# Patient Record
Sex: Male | Born: 1995 | Race: White | Hispanic: No | Marital: Single | Smoking: Never smoker
Health system: Southern US, Community
[De-identification: ages and names within clinical notes are randomized; demographics above are authoritative.]

## PROBLEM LIST (undated history)

## (undated) DIAGNOSIS — J45909 Unspecified asthma, uncomplicated: Secondary | ICD-10-CM

## (undated) DIAGNOSIS — T7840XA Allergy, unspecified, initial encounter: Secondary | ICD-10-CM

## (undated) HISTORY — PX: TONSILLECTOMY: SUR1361

## (undated) HISTORY — DX: Unspecified asthma, uncomplicated: J45.909

---
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
## 2017-08-16 ENCOUNTER — Encounter: Payer: Self-pay | Admitting: Podiatry

## 2017-08-16 ENCOUNTER — Ambulatory Visit (INDEPENDENT_AMBULATORY_CARE_PROVIDER_SITE_OTHER): Payer: BLUE CROSS/BLUE SHIELD | Admitting: Podiatry

## 2017-08-16 VITALS — BP 135/91 | HR 68 | Resp 16

## 2017-08-16 DIAGNOSIS — L600 Ingrowing nail: Secondary | ICD-10-CM

## 2017-08-16 NOTE — Patient Instructions (Signed)

## 2017-08-16 NOTE — Progress Notes (Signed)
Subjective:    Patient ID: Jonathan Mitchell, male   DOB: 21 y.o.   MRN: [id number]   HPI patient presents with a painful hallux nail deformity bilateral stating that it's been going on for a while he's tried to soak it and trim it without relief of symptoms    ROS      Objective:  Physical Exam neurovascular status intact with incurvated lateral border right hallux and medial border the left hallux with pain and distal redness but no active drainage noted     Assessment:    Chronic ingrown toenail deformity hallux bilateral     Plan:    H&P conditions reviewed and recommended removal of the borders explaining procedure and risk. Patient signed consent form and today I infiltrated each hallux 60 mg like Marcaine mixture and remove the lateral border the right hallux medial border the left hallux exposed matrix and applied phenol 3 applications 30 seconds followed by alcohol lavaged to each border followed by sterile dressing. Gave instructions on soaks and reappoint

## 2018-10-19 ENCOUNTER — Other Ambulatory Visit: Payer: Self-pay

## 2018-10-19 ENCOUNTER — Emergency Department (HOSPITAL_COMMUNITY)
Admission: EM | Admit: 2018-10-19 | Discharge: 2018-10-19 | Disposition: A | Discharge status: Home / Self Care | Payer: BLUE CROSS/BLUE SHIELD | Attending: Emergency Medicine | Admitting: Emergency Medicine

## 2018-10-19 ENCOUNTER — Encounter (HOSPITAL_COMMUNITY): Payer: Self-pay

## 2018-10-19 ENCOUNTER — Emergency Department (HOSPITAL_COMMUNITY): Payer: BLUE CROSS/BLUE SHIELD

## 2018-10-19 DIAGNOSIS — T22112A Burn of first degree of left forearm, initial encounter: Secondary | ICD-10-CM | POA: Diagnosis not present

## 2018-10-19 DIAGNOSIS — Z23 Encounter for immunization: Secondary | ICD-10-CM | POA: Insufficient documentation

## 2018-10-19 DIAGNOSIS — T2007XA Burn of unspecified degree of neck, initial encounter: Secondary | ICD-10-CM | POA: Diagnosis present

## 2018-10-19 DIAGNOSIS — T22111A Burn of first degree of right forearm, initial encounter: Secondary | ICD-10-CM | POA: Insufficient documentation

## 2018-10-19 DIAGNOSIS — X088XXA Exposure to other specified smoke, fire and flames, initial encounter: Secondary | ICD-10-CM | POA: Diagnosis not present

## 2018-10-19 DIAGNOSIS — Y9289 Other specified places as the place of occurrence of the external cause: Secondary | ICD-10-CM | POA: Diagnosis not present

## 2018-10-19 DIAGNOSIS — Y990 Civilian activity done for income or pay: Secondary | ICD-10-CM | POA: Insufficient documentation

## 2018-10-19 DIAGNOSIS — T2017XA Burn of first degree of neck, initial encounter: Secondary | ICD-10-CM | POA: Insufficient documentation

## 2018-10-19 DIAGNOSIS — T300 Burn of unspecified body region, unspecified degree: Secondary | ICD-10-CM

## 2018-10-19 DIAGNOSIS — Y9389 Activity, other specified: Secondary | ICD-10-CM | POA: Diagnosis not present

## 2018-10-19 DIAGNOSIS — Z79899 Other long term (current) drug therapy: Secondary | ICD-10-CM | POA: Insufficient documentation

## 2018-10-19 MED ORDER — IBUPROFEN 600 MG PO TABS: 600.0000 mg | ORAL_TABLET | Freq: Four times a day (QID) | ORAL | 0 refills | Status: AC | PRN

## 2018-10-19 MED ORDER — SILVER SULFADIAZINE 1 % EX CREA
TOPICAL_CREAM | Freq: Once | CUTANEOUS | Status: AC
  Administered 2018-10-19: 17:00:00 via TOPICAL
  Filled 2018-10-19: qty 50

## 2018-10-19 MED ORDER — TETANUS-DIPHTH-ACELL PERTUSSIS 5-2.5-18.5 LF-MCG/0.5 IM SUSP
0.5000 mL | Freq: Once | INTRAMUSCULAR | Status: AC
  Administered 2018-10-19: 0.5 mL via INTRAMUSCULAR
  Filled 2018-10-19: qty 0.5

## 2018-10-19 MED ORDER — IBUPROFEN 800 MG PO TABS
800.0000 mg | ORAL_TABLET | Freq: Once | ORAL | Status: AC
  Administered 2018-10-19: 800 mg via ORAL
  Filled 2018-10-19: qty 1
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
## 2018-10-19 NOTE — Discharge Instructions (Signed)
Please cleanse skin and apply silvadene cream to burn area twice daily.  Monitor for signs of infection.  Take ibuprofen as needed for pain. Return if your condition worsen, if you have trouble breathing or if you have any concerns.

## 2019-03-18 ENCOUNTER — Ambulatory Visit: Payer: Self-pay | Admitting: *Deleted

## 2019-03-18 ENCOUNTER — Telehealth: Payer: PRIVATE HEALTH INSURANCE | Admitting: Nurse Practitioner

## 2019-03-18 DIAGNOSIS — J069 Acute upper respiratory infection, unspecified: Secondary | ICD-10-CM

## 2019-03-18 MED ORDER — BENZONATATE 100 MG PO CAPS: 100.0000 mg | ORAL_CAPSULE | Freq: Three times a day (TID) | ORAL | 0 refills | Status: AC | PRN

## 2019-03-18 NOTE — Telephone Encounter (Signed)
Pt called with having a sore throat and some shortness of breath with exertion. Temp yesterday was 98.6 and today up to 100.  He has a dry cough, and a  runny nose.He denies body aches, headache, or chest tightness.  He lives with his girlfriend, and she does not have any symptoms. He stated that he normally sees a provider at [facility]. Advised that he could go to an urgent care for his symptoms. He voiced understanding. Will call back for increase in symptoms.  Reason for Disposition . MILD difficulty breathing (e.g., minimal/no SOB at rest, SOB with walking, pulse <100)  Answer Assessment - Initial Assessment Questions .  COVID-19 DIAGNOSIS: "Who made your Coronavirus (COVID-19) diagnosis?" "Was it confirmed by a positive lab test?" If not diagnosed by a HCP, ask "Are there lots of cases (community spread) where you live?" (See public health department website, if unsure)   * MAJOR community spread: high number of cases; numbers of cases are increasing; many people hospitalized.   * MINOR community spread: low number of cases; not increasing; few or no people hospitalized     In the community 2. ONSET: "When did the COVID-19 symptoms start?"      today 3. WORST SYMPTOM: "What is your worst symptom?" (e.g., cough, fever, shortness of breath, muscle aches)     Sore throat 4. COUGH: "How bad is the cough?"       Dry cough 5. FEVER: "Do you have a fever?" If so, ask: "What is your temperature, how was it measured, and when did it start?"     Was 98.6 yesterday and up to 100 today 6. RESPIRATORY STATUS: "Describe your breathing?" (e.g., shortness of breath, wheezing, unable to speak)      Some shortness of breath exp when walking 7. BETTER-SAME-WORSE: "Are you getting better, staying the same or getting worse compared to yesterday?"  If getting worse, ask, "In what way?"     worst 8. HIGH RISK DISEASE: "Do you have any chronic medical problems?" (e.g., asthma, heart or lung disease, weak  immune system, etc.)     no  10. OTHER SYMPTOMS: "Do you have any other symptoms?"  (e.g., runny nose, headache, sore throat, loss of smell)       Sore throat, runny nose  Protocols used: CORONAVIRUS (COVID-19) DIAGNOSED OR SUSPECTED-A-AH

## 2019-03-18 NOTE — Progress Notes (Signed)
E-Visit for Corona Virus Screening  Based on your current symptoms, you may very well have the virus, however your symptoms are mild. Currently, not all patients are being tested. If the symptoms are mild and there is not a known exposure, performing the test is not indicated.  Coronavirus disease 2019 (COVID-19) is a respiratory illness that can spread from person to person. The virus that causes COVID-19 is a new virus that was first identified in the country of [place] but is now found in multiple other countries and has spread to the [place].  Symptoms associated with the virus are mild to severe fever, cough, and shortness of breath. There is currently no vaccine to protect against COVID-19, and there is no specific antiviral treatment for the virus.   To be considered HIGH RISK for Coronavirus (COVID-19), you have to meet the following criteria:  . Traveled to [place], [place], [place], [place] or [place]; or in the [place] to [place], [place], [place], or [place]; and have fever, cough, and shortness of breath within the last 2 weeks of travel OR  . Been in close contact with a person diagnosed with COVID-19 within the last 2 weeks and have fever, cough, and shortness of breath  . IF YOU DO NOT MEET THESE CRITERIA, YOU ARE CONSIDERED LOW RISK FOR COVID-19.   It is vitally important that if you feel that you have an infection such as this virus or any other virus that you stay home and away from places where you may spread it to others.  You should self-quarantine for 14 days if you have symptoms that could potentially be coronavirus and avoid contact with people age 65 and older.   You can use medication such as A prescription cough medication called Tessalon Perles 100 mg. You may take 1-2 capsules every 8 hours as needed for cough  You may also take acetaminophen (Tylenol) as needed for fever.   Reduce your risk of any infection by using the same precautions used for  avoiding the common cold or flu:  . Wash your hands often with soap and warm water for at least 20 seconds.  If soap and water are not readily available, use an alcohol-based hand sanitizer with at least 60% alcohol.  . If coughing or sneezing, cover your mouth and nose by coughing or sneezing into the elbow areas of your shirt or coat, into a tissue or into your sleeve (not your hands). . Avoid shaking hands with others and consider head nods or verbal greetings only. . Avoid touching your eyes, nose, or mouth with unwashed hands.  . Avoid close contact with people who are sick. . Avoid places or events with large numbers of people in one location, like concerts or sporting events. . Carefully consider travel plans you have or are making. . If you are planning any travel outside or inside the [place], visit the CDC's Travelers' Health webpage for the latest health notices. . If you have some symptoms but not all symptoms, continue to monitor at home and seek medical attention if your symptoms worsen. . If you are having a medical emergency, call 911.  HOME CARE . Only take medications as instructed by your medical team. . Drink plenty of fluids and get plenty of rest. . A steam or ultrasonic humidifier can help if you have congestion.   GET HELP RIGHT AWAY IF: . You develop worsening fever. . You become short of breath . You cough   up blood. . Your symptoms become more severe MAKE SURE YOU   Understand these instructions.  Will watch your condition.  Will get help right away if you are not doing well or get worse.  Your e-visit answers were reviewed by a board certified advanced clinical practitioner to complete your personal care plan.  Depending on the condition, your plan could have included both over the counter or prescription medications.  If there is a problem please reply once you have received a response from your provider. Your safety is important to us.  If you have drug  allergies check your prescription carefully.    You can use MyChart to ask questions about today's visit, request a non-urgent call back, or ask for a work or school excuse for 24 hours related to this e-Visit. If it has been greater than 24 hours you will need to follow up with your provider, or enter a new e-Visit to address those concerns. You will get an e-mail in the next two days asking about your experience.  I hope that your e-visit has been valuable and will speed your recovery. Thank you for using e-visits.  5 minutes spent reviewing and documenting in chart.  

## 2020-09-16 ENCOUNTER — Emergency Department (HOSPITAL_COMMUNITY)
Admission: EM | Admit: 2020-09-16 | Discharge: 2020-09-17 | Disposition: A | Discharge status: Home / Self Care | Payer: BC Managed Care – PPO | Attending: Emergency Medicine | Admitting: Emergency Medicine

## 2020-09-16 ENCOUNTER — Emergency Department (HOSPITAL_COMMUNITY): Payer: BC Managed Care – PPO

## 2020-09-16 ENCOUNTER — Encounter (HOSPITAL_COMMUNITY): Payer: Self-pay | Admitting: Emergency Medicine

## 2020-09-16 ENCOUNTER — Other Ambulatory Visit: Payer: Self-pay

## 2020-09-16 DIAGNOSIS — R0602 Shortness of breath: Secondary | ICD-10-CM | POA: Diagnosis not present

## 2020-09-16 DIAGNOSIS — R0789 Other chest pain: Secondary | ICD-10-CM | POA: Diagnosis present

## 2020-09-16 LAB — BASIC METABOLIC PANEL
Anion gap: 9 (ref 5–15)
BUN: 11 mg/dL (ref 6–20)
CO2: 27 mmol/L (ref 22–32)
Calcium: 9.5 mg/dL (ref 8.9–10.3)
Chloride: 101 mmol/L (ref 98–111)
Creatinine, Ser: 1.06 mg/dL (ref 0.61–1.24)
GFR, Estimated: >60 mL/min (ref >60)
Glucose, Bld: 93 mg/dL (ref 70–99)
Potassium: 3.8 mmol/L (ref 3.5–5.1)
Sodium: 137 mmol/L (ref 135–145)

## 2020-09-16 LAB — CBC
HCT: 44 % (ref 39.0–52.0)
Hemoglobin: 15.1 g/dL (ref 13.0–17.0)
MCH: 29 pg (ref 26.0–34.0)
MCHC: 34.3 g/dL (ref 30.0–36.0)
MCV: 84.5 fL (ref 80.0–100.0)
Platelets: 187 10*3/uL (ref 150–400)
RBC: 5.21 MIL/uL (ref 4.22–5.81)
RDW: 12.5 % (ref 11.5–15.5)
WBC: 8.3 10*3/uL (ref 4.0–10.5)
nRBC: 0 % (ref 0.0–0.2)

## 2020-09-16 LAB — TROPONIN I (HIGH SENSITIVITY): Troponin I (High Sensitivity): <2 ng/L (ref <18)

## 2020-09-16 NOTE — ED Provider Notes (Signed)
[facility] EMERGENCY DEPARTMENT Provider Note   CSN: [id number] Arrival date & time: 09/16/20  2011     History Chief Complaint  Patient presents with  . Chest Pain    Jonathan Mitchell is a 24 y.o. male.  Patient presents to the emergency department for evaluation of chest pain that began around noon today.  He reports that he was doing some heavy exertion at work when it began.  He does feel some shortness of breath.  He describes the discomfort as an "air bubble" in the center of his chest.        History reviewed. No pertinent past medical history.  Patient Active Problem List   Diagnosis Date Noted  . Ingrown toenail 10/02/2016    History reviewed. No pertinent surgical history.     No family history on file.  Social History   Tobacco Use  . Smoking status: Never Smoker  . Smokeless tobacco: Never Used  Substance Use Topics  . Alcohol use: Not Currently  . Drug use: Not Currently    Home Medications Prior to Admission medications   Medication Sig Start Date End Date Taking? Authorizing Provider  ibuprofen (ADVIL,MOTRIN) 600 MG tablet Take 1 tablet (600 mg total) by mouth every 6 (six) hours as needed. 10/19/18   Fayrene Helper, PA-C  MYORISAN 40 MG capsule Take 40 mg by mouth 2 (two) times daily. 07/31/17   [provider]  pantoprazole (PROTONIX) 40 MG tablet Take 1 tablet (40 mg total) by mouth daily. 09/17/20   Gilda Crease, MD    Allergies    Penicillins  Review of Systems   Review of Systems  Respiratory: Positive for shortness of breath.   Cardiovascular: Positive for chest pain.  All other systems reviewed and are negative.   Physical Exam Updated Vital Signs BP 140/73   Pulse 69   Temp 98.3 F (36.8 C) (Oral)   Resp 16   Ht 6\' 2"  (1.88 m)   Wt 79.4 kg   SpO2 100%   BMI 22.47 kg/m   Physical Exam Vitals and nursing note reviewed.  Constitutional:      General: He is not in acute distress.     Appearance: Normal appearance. He is well-developed.  HENT:     Head: Normocephalic and atraumatic.     Right Ear: Hearing normal.     Left Ear: Hearing normal.     Nose: Nose normal.  Eyes:     Conjunctiva/sclera: Conjunctivae normal.     Pupils: Pupils are equal, round, and reactive to light.  Cardiovascular:     Rate and Rhythm: Regular rhythm.     Heart sounds: S1 normal and S2 normal. No murmur heard.  No friction rub. No gallop.   Pulmonary:     Effort: Pulmonary effort is normal. No respiratory distress.     Breath sounds: Normal breath sounds.  Chest:     Chest wall: No tenderness.  Abdominal:     General: Bowel sounds are normal.     Palpations: Abdomen is soft.     Tenderness: There is no abdominal tenderness. There is no guarding or rebound. Negative signs include Murphy's sign and McBurney's sign.     Hernia: No hernia is present.  Musculoskeletal:        General: Normal range of motion.     Cervical back: Normal range of motion and neck supple.  Skin:    General: Skin is warm and dry.  Findings: No rash.  Neurological:     Mental Status: He is alert and oriented to person, place, and time.     GCS: GCS eye subscore is 4. GCS verbal subscore is 5. GCS motor subscore is 6.     Cranial Nerves: No cranial nerve deficit.     Sensory: No sensory deficit.     Coordination: Coordination normal.  Psychiatric:        Speech: Speech normal.        Behavior: Behavior normal.        Thought Content: Thought content normal.     ED Results / Procedures / Treatments   Labs (all labs ordered are listed, but only abnormal results are displayed) Labs Reviewed  BASIC METABOLIC PANEL  CBC  D-DIMER, QUANTITATIVE (NOT AT [facility])  TROPONIN I (HIGH SENSITIVITY)  TROPONIN I (HIGH SENSITIVITY)    EKG EKG Interpretation  Date/Time:  Thursday September 16 2020 20:36:16 EDT Ventricular Rate:  88 PR Interval:  188 QRS Duration: 106 QT Interval:  352 QTC Calculation: 425 R  Axis:   92 Text Interpretation: Normal sinus rhythm with sinus arrhythmia Rightward axis Incomplete right bundle branch block Borderline ECG No previous tracing Confirmed by Gilda Crease [id number]) on 09/16/2020 11:54:22 PM   Radiology DG Chest 2 View  Result Date: 09/16/2020 CLINICAL DATA:  Central chest pain EXAM: CHEST - 2 VIEW COMPARISON:  Radiograph 10/19/2018 FINDINGS: No consolidation, features of edema, pneumothorax, or effusion. Pulmonary vascularity is normally distributed. The cardiomediastinal contours are unremarkable. No acute osseous or soft tissue abnormality. IMPRESSION: No acute cardiopulmonary abnormality. Electronically Signed   By: Kreg Shropshire M.D.   On: 09/16/2020 20:57    Procedures Procedures (including critical care time)  Medications Ordered in ED Medications  alum & mag hydroxide-simeth (MAALOX/MYLANTA) 200-200-20 MG/5ML suspension 30 mL (has no administration in time range)    And  lidocaine (XYLOCAINE) 2 % viscous mouth solution 15 mL (has no administration in time range)    ED Course  I have reviewed the triage vital signs and the nursing notes.  Pertinent labs & imaging results that were available during my care of the patient were reviewed by me and considered in my medical decision making (see chart for details).    MDM Rules/Calculators/A&P                          Patient presents to the emergency department for evaluation of chest discomfort.  Symptoms ongoing since noon today.  Patient does not have any cardiac risk factors.  Vital signs are normal.  No hypoxia, tachypnea or tachycardia.  He does not have any PE risk factors.  EKG does not show any ischemia or infarct.  Troponins are negative x2.  D-dimer is normal.  Patient reports discomfort worsens when he swallows.  Chest x-ray does not show any acute abnormality.  No forceful vomiting or any risk factors for esophageal rupture.  Patient reassured, no further work-up necessary.  Will be  given a course of Protonix.  Final Clinical Impression(s) / ED Diagnoses Final diagnoses:  Atypical chest pain    Rx / DC Orders ED Discharge Orders         Ordered    pantoprazole (PROTONIX) 40 MG tablet  Daily        09/17/20 0059           Gilda Crease, MD 09/17/20 [phone]

## 2020-09-16 NOTE — ED Notes (Signed)
ED Provider at bedside. 

## 2020-09-16 NOTE — ED Triage Notes (Signed)
Pt c/o central chest pain since 12 pm, denies any sob, no nausea or vomiting.

## 2020-09-17 LAB — D-DIMER, QUANTITATIVE: D-Dimer, Quant: 0.37 ug{FEU}/mL (ref 0.00–0.50)

## 2020-09-17 LAB — TROPONIN I (HIGH SENSITIVITY): Troponin I (High Sensitivity): <2 ng/L (ref <18)

## 2020-09-17 MED ORDER — LIDOCAINE VISCOUS HCL 2 % MT SOLN
15.0000 mL | Freq: Once | OROMUCOSAL | Status: AC
  Administered 2020-09-17: 15 mL via ORAL
  Filled 2020-09-17: qty 15

## 2020-09-17 MED ORDER — ALUM & MAG HYDROXIDE-SIMETH 200-200-20 MG/5ML PO SUSP
30.0000 mL | Freq: Once | ORAL | Status: AC
  Administered 2020-09-17: 30 mL via ORAL
  Filled 2020-09-17: qty 30

## 2020-09-17 MED ORDER — PANTOPRAZOLE SODIUM 40 MG PO TBEC: 40.0000 mg | DELAYED_RELEASE_TABLET | Freq: Every day | ORAL | 0 refills | Status: AC

## 2020-09-17 NOTE — ED Notes (Signed)
Pt verbalized understanding of verbal and written d/c instructions

## 2020-09-17 NOTE — ED Notes (Signed)
Pt given a cup of water 

## 2020-09-17 NOTE — Progress Notes (Signed)
09/17/2020 308 pm TOC CM received call from pt and states his pharmacy did not receive his rx. Contacted [facility] and called in Protonix to pharmacy. Epic escribe system is down.  Isidoro Donning RN CCM, [facility] ED TOC CM [phone]

## 2021-03-28 IMAGING — DX DG CHEST 2V
2 series · 2 of 2 positions shown · non-contrast
Comparison: Radiograph 10/19/2018

CLINICAL DATA: Central chest pain

EXAM:
CHEST - 2 VIEW

[chest pa]
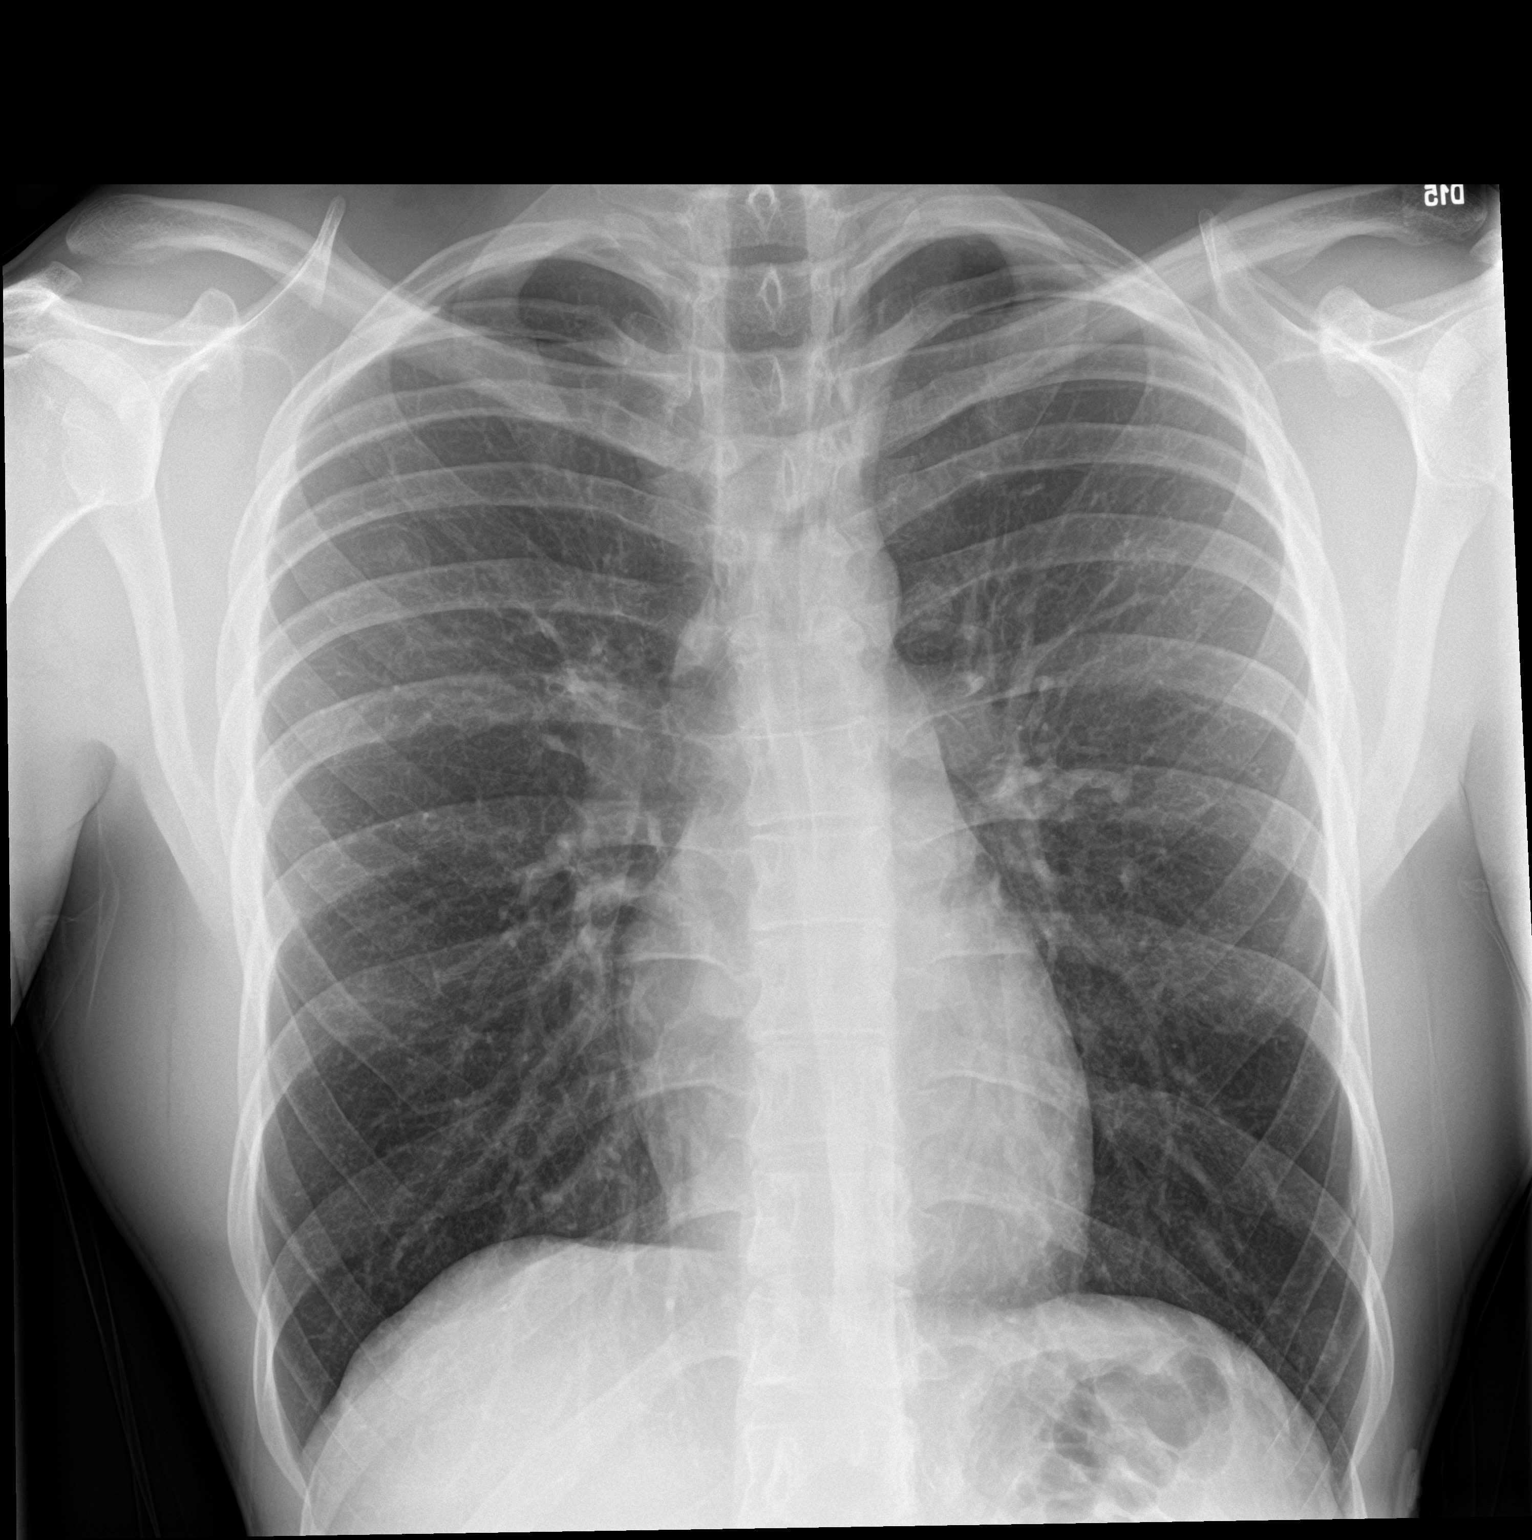

[chest lat]
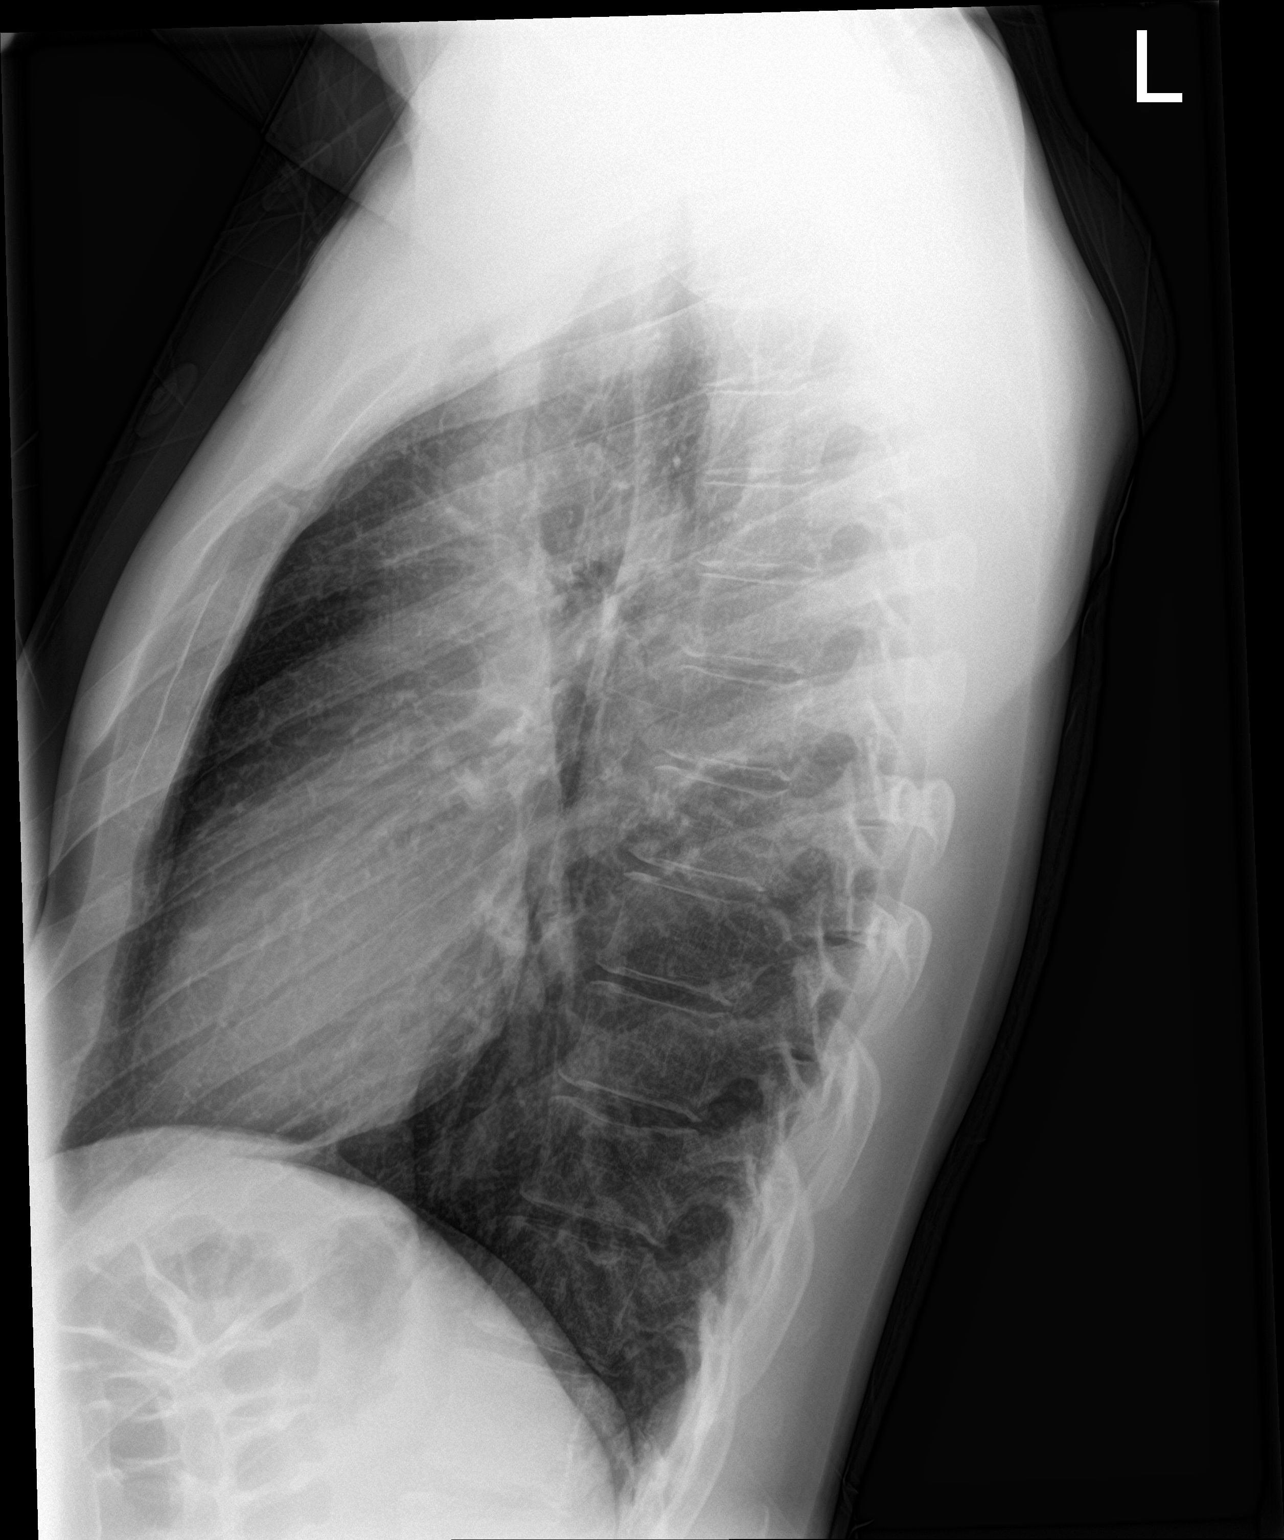

[2 of 2 positions shown; findings below may reference images not displayed]

FINDINGS: No consolidation, features of edema, pneumothorax, or effusion.
Pulmonary vascularity is normally distributed. The cardiomediastinal
contours are unremarkable. No acute osseous or soft tissue
abnormality.
IMPRESSION: No acute cardiopulmonary abnormality.

## 2024-01-08 ENCOUNTER — Other Ambulatory Visit: Payer: Self-pay

## 2024-01-08 ENCOUNTER — Encounter (HOSPITAL_COMMUNITY): Payer: Self-pay | Admitting: Emergency Medicine

## 2024-01-08 ENCOUNTER — Ambulatory Visit (INDEPENDENT_AMBULATORY_CARE_PROVIDER_SITE_OTHER): Payer: BC Managed Care – PPO

## 2024-01-08 ENCOUNTER — Ambulatory Visit (HOSPITAL_COMMUNITY)
Admission: EM | Admit: 2024-01-08 | Discharge: 2024-01-08 | Disposition: A | Discharge status: Home / Self Care | Payer: BC Managed Care – PPO | Attending: Physician Assistant | Admitting: Physician Assistant

## 2024-01-08 DIAGNOSIS — R0789 Other chest pain: Secondary | ICD-10-CM

## 2024-01-08 DIAGNOSIS — M546 Pain in thoracic spine: Secondary | ICD-10-CM | POA: Diagnosis not present

## 2024-01-08 MED ORDER — MELOXICAM 15 MG PO TABS: 15.0000 mg | ORAL_TABLET | Freq: Every day | ORAL | 0 refills | Status: AC

## 2024-01-08 NOTE — ED Triage Notes (Addendum)
 One week ago started having upper left back pain.  Pain has worsened gradually over this past week.  Pain is now in now felt just left of sternum, described as sharp pain with inspiration and with twisting of torso   Patient reports he initially had back pain 2 months ago after doing a lot of heavy lifting.    Has not had any medications for discomfort

## 2024-01-08 NOTE — ED Provider Notes (Signed)
 [facility]    CSN: [id number] Arrival date & time: 01/08/24  1532      History   Chief Complaint Chief Complaint  Patient presents with   Back Pain    HPI Jonathan Mitchell is a 28 y.o. male.   HPI   He reports chest pain started yesterday  He has had back pain for about a week. He reports having similar back pain about 2 months ago but this resolved with rest   He states chest pain is along sternum and left pectoral area He denies recent injury or trauma  States he was doing weight training 2 months ago and felt a popping sensation along left side of his back which led to back pain  He states yesterday he was brushing his teeth and turned and the pain started  He states pain in chest is more prevalent with deep inspiration. He denies pain being reproducible with palpation  He reports chest pains are more noticeable with exertion   He denies family hx of heart disease   Interventions: ibuprofen , muscle relaxers     History reviewed. No pertinent past medical history.  Patient Active Problem List   Diagnosis Date Noted   Ingrown toenail 10/02/2016    History reviewed. No pertinent surgical history.     Home Medications    Prior to Admission medications   Medication Sig Start Date End Date Taking? Authorizing Provider  meloxicam (MOBIC) 15 MG tablet Take 1 tablet (15 mg total) by mouth daily. 01/08/24  Yes Hatice Bubel E, PA-C  ibuprofen  (ADVIL ,MOTRIN ) 600 MG tablet Take 1 tablet (600 mg total) by mouth every 6 (six) hours as needed. 10/19/18   Nivia Colon, PA-C  MYORISAN 40 MG capsule Take 40 mg by mouth 2 (two) times daily. Patient not taking: Reported on 01/08/2024 07/31/17   [provider]  pantoprazole (PROTONIX) 40 MG tablet Take 1 tablet (40 mg total) by mouth daily. Patient not taking: Reported on 01/08/2024 09/17/20   Haze Lonni PARAS, MD    Family History History reviewed. No pertinent family history.  Social History Social  History   Tobacco Use   Smoking status: Never   Smokeless tobacco: Never  Vaping Use   Vaping status: Never Used  Substance Use Topics   Alcohol use: Not Currently   Drug use: Not Currently     Allergies   Penicillins   Review of Systems Review of Systems  Respiratory:  Negative for cough, shortness of breath and wheezing.   Cardiovascular:  Positive for chest pain.  Musculoskeletal:  Positive for back pain.     Physical Exam Triage Vital Signs ED Triage Vitals  Encounter Vitals Group     BP 01/08/24 1628 123/71     Systolic BP Percentile --      Diastolic BP Percentile --      Pulse Rate 01/08/24 1628 77     Resp 01/08/24 1628 18     Temp 01/08/24 1628 99 F (37.2 C)     Temp Source 01/08/24 1628 Oral     SpO2 01/08/24 1628 97 %     Weight --      Height --      Head Circumference --      Peak Flow --      Pain Score 01/08/24 1625 6     Pain Loc --      Pain Education --      Exclude from Growth Chart --  No data found.  Updated Vital Signs BP 123/71 (BP Location: Right Arm)   Pulse 77   Temp 99 F (37.2 C) (Oral)   Resp 18   SpO2 97%   Visual Acuity Right Eye Distance:   Left Eye Distance:   Bilateral Distance:    Right Eye Near:   Left Eye Near:    Bilateral Near:     Physical Exam Vitals reviewed.  Constitutional:      General: He is awake.     Appearance: Normal appearance. He is well-developed and well-groomed.  HENT:     Head: Normocephalic and atraumatic.  Cardiovascular:     Rate and Rhythm: Normal rate and regular rhythm.     Heart sounds: Normal heart sounds. No murmur heard.    No friction rub. No gallop.  Pulmonary:     Effort: Pulmonary effort is normal.     Breath sounds: Normal breath sounds. No decreased air movement. No decreased breath sounds, wheezing, rhonchi or rales.  Chest:     Chest wall: Tenderness present. No mass or swelling.  Musculoskeletal:     Right shoulder: Normal.     Left shoulder: Tenderness  present. No swelling, deformity, effusion, laceration or crepitus. Normal range of motion. Normal strength. Normal pulse.     Comments: Tenderness with shoulder extension and adduction. He reports tenderness with internal and external rotation.   Neurological:     General: No focal deficit present.     Mental Status: He is alert and oriented to person, place, and time.     GCS: GCS eye subscore is 4. GCS verbal subscore is 5. GCS motor subscore is 6.  Psychiatric:        Attention and Perception: Attention and perception normal.        Mood and Affect: Mood and affect normal.        Speech: Speech normal.        Behavior: Behavior normal. Behavior is cooperative.      UC Treatments / Results  Labs (all labs ordered are listed, but only abnormal results are displayed) Labs Reviewed - No data to display  EKG   Radiology DG Ribs Unilateral W/Chest Left Result Date: 01/08/2024 CLINICAL DATA:  Left-sided chest wall and upper back pain. EXAM: LEFT RIBS AND CHEST - 3+ VIEW COMPARISON:  Chest x-ray dated September 16, 2020. FINDINGS: No fracture or other bone lesions are seen involving the ribs. There is no evidence of pneumothorax or pleural effusion. Both lungs are clear. Heart size and mediastinal contours are within normal limits. IMPRESSION: Negative. Electronically Signed   By: Elsie ONEIDA Shoulder M.D.   On: 01/08/2024 17:44    Procedures Procedures (including critical care time)  Medications Ordered in UC Medications - No data to display  Initial Impression / Assessment and Plan / UC Course  I have reviewed the triage vital signs and the nursing notes.  Pertinent labs & imaging results that were available during my care of the patient were reviewed by me and considered in my medical decision making (see chart for details).      Final Clinical Impressions(s) / UC Diagnoses   Final diagnoses:  Left-sided chest wall pain  Acute left-sided thoracic back pain   Acute, new  concern Patient reports that he is having left-sided thoracic back pain as well as left-sided chest wall pain.  He states that he has had previous back pain similar to this about 2 months ago that seemingly resolved.  He  reports pain is similar in nature to previous back pain. Left rib xray was negative for acute injury. Physical exam and HPI are consistent with likely muscular etiology. Recommend conservative measures such as warm compresses, gentle massage and stretches as tolerated, NSAIDs and tylenol . Will send in meloxicam for pain management Recommend follow up with PCP or Orthopedics for ongoing or worsening symptoms      Discharge Instructions      Your x-ray was negative for signs of acute injury. At this time I suspect that you have a muscular injury in your thoracic that has spread to your chest.  At this time I recommend conservative measures to help manage her symptoms.  These include warm compresses to the area, gentle stretches and massage as tolerated, pain relief with over-the-counter medications such as Tylenol . I have sent in a script for Meloxicam for you to take once per day. DO NOT use other NSAIDs while taking this medication (ibuprofen , motrin , aleve, advil , naproxen, etc)   If your symptoms seem like they are not improving or getting worse I recommend going to orthopedic urgent care for further evaluation and potential management.      ED Prescriptions     Medication Sig Dispense Auth. Provider   meloxicam (MOBIC) 15 MG tablet Take 1 tablet (15 mg total) by mouth daily. 30 tablet Shanan Mcmiller E, PA-C      PDMP not reviewed this encounter.   Marylene Rocky BRAVO, PA-C 01/08/24 1757

## 2024-01-08 NOTE — Discharge Instructions (Addendum)
 Your x-ray was negative for signs of acute injury. At this time I suspect that you have a muscular injury in your thoracic that has spread to your chest.  At this time I recommend conservative measures to help manage her symptoms.  These include warm compresses to the area, gentle stretches and massage as tolerated, pain relief with over-the-counter medications such as Tylenol . I have sent in a script for Meloxicam for you to take once per day. DO NOT use other NSAIDs while taking this medication (ibuprofen , motrin , aleve, advil , naproxen, etc)   If your symptoms seem like they are not improving or getting worse I recommend going to orthopedic urgent care for further evaluation and potential management.

## 2024-01-26 ENCOUNTER — Emergency Department (HOSPITAL_COMMUNITY)
Admission: EM | Admit: 2024-01-26 | Discharge: 2024-01-26 | Disposition: A | Discharge status: Home / Self Care | Payer: BC Managed Care – PPO | Attending: Emergency Medicine | Admitting: Emergency Medicine

## 2024-01-26 ENCOUNTER — Encounter (HOSPITAL_COMMUNITY): Payer: Self-pay

## 2024-01-26 ENCOUNTER — Other Ambulatory Visit: Payer: Self-pay

## 2024-01-26 DIAGNOSIS — T6591XA Toxic effect of unspecified substance, accidental (unintentional), initial encounter: Secondary | ICD-10-CM

## 2024-01-26 DIAGNOSIS — T391X1A Poisoning by 4-Aminophenol derivatives, accidental (unintentional), initial encounter: Secondary | ICD-10-CM | POA: Diagnosis present

## 2024-01-26 LAB — COMPREHENSIVE METABOLIC PANEL
ALT: 24 U/L (ref 0–44)
AST: 24 U/L (ref 15–41)
Albumin: 4.9 g/dL (ref 3.5–5.0)
Alkaline Phosphatase: 46 U/L (ref 38–126)
Anion gap: 8 (ref 5–15)
BUN: 14 mg/dL (ref 6–20)
CO2: 25 mmol/L (ref 22–32)
Calcium: 8.8 mg/dL — ABNORMAL LOW (ref 8.9–10.3)
Chloride: 105 mmol/L (ref 98–111)
Creatinine, Ser: 0.77 mg/dL (ref 0.61–1.24)
GFR, Estimated: >60 mL/min (ref >60)
Glucose, Bld: 99 mg/dL (ref 70–99)
Potassium: 3.6 mmol/L (ref 3.5–5.1)
Sodium: 138 mmol/L (ref 135–145)
Total Bilirubin: 1.2 mg/dL (ref 0.0–1.2)
Total Protein: 7.3 g/dL (ref 6.5–8.1)

## 2024-01-26 LAB — CBC WITH DIFFERENTIAL/PLATELET
Abs Immature Granulocytes: 0.01 10*3/uL (ref 0.00–0.07)
Basophils Absolute: 0.1 10*3/uL (ref 0.0–0.1)
Basophils Relative: 1 %
Eosinophils Absolute: 0.1 10*3/uL (ref 0.0–0.5)
Eosinophils Relative: 2 %
HCT: 42.1 % (ref 39.0–52.0)
Hemoglobin: 14.8 g/dL (ref 13.0–17.0)
Immature Granulocytes: 0 %
Lymphocytes Relative: 25 %
Lymphs Abs: 1.1 10*3/uL (ref 0.7–4.0)
MCH: 29.2 pg (ref 26.0–34.0)
MCHC: 35.2 g/dL (ref 30.0–36.0)
MCV: 83.2 fL (ref 80.0–100.0)
Monocytes Absolute: 0.3 10*3/uL (ref 0.1–1.0)
Monocytes Relative: 8 %
Neutro Abs: 2.8 10*3/uL (ref 1.7–7.7)
Neutrophils Relative %: 64 %
Platelets: 166 10*3/uL (ref 150–400)
RBC: 5.06 MIL/uL (ref 4.22–5.81)
RDW: 12.5 % (ref 11.5–15.5)
WBC: 4.4 10*3/uL (ref 4.0–10.5)
nRBC: 0 % (ref 0.0–0.2)

## 2024-01-26 LAB — ACETAMINOPHEN LEVEL: Acetaminophen (Tylenol), Serum: 25 ug/mL (ref 10–30)

## 2024-01-26 LAB — SALICYLATE LEVEL: Salicylate Lvl: <7 mg/dL — ABNORMAL LOW (ref 7.0–30.0)

## 2024-01-26 NOTE — ED Triage Notes (Signed)
 Pt reports taking 5 of the 650 mg Tylenol at 0800. Pt did not mean to take that many tylenol at that dose.

## 2024-01-26 NOTE — ED Provider Notes (Signed)
 [facility] AT [facility] Provider Note   CSN: [id number] Arrival date & time: 01/26/24  1051     History  Chief Complaint  Patient presents with   Ingestion    Jonathan Mitchell is a 28 y.o. male presents today after accidentally ingesting 5 650 mg tablets of Tylenol this morning.  Patient states that he took these around 0800.  Patient states that he was not trying to hurt himself but thought that they were 200 mg tablets.  Patient did initially have nausea and anxiety both of which have resolved.  Patient denies any complaints at this time.   Ingestion       Home Medications Prior to Admission medications   Medication Sig Start Date End Date Taking? Authorizing Provider  ibuprofen (ADVIL,MOTRIN) 600 MG tablet Take 1 tablet (600 mg total) by mouth every 6 (six) hours as needed. 10/19/18   Fayrene Helper, PA-C  meloxicam (MOBIC) 15 MG tablet Take 1 tablet (15 mg total) by mouth daily. 01/08/24   Mecum, Erin E, PA-C  MYORISAN 40 MG capsule Take 40 mg by mouth 2 (two) times daily. Patient not taking: Reported on 01/08/2024 07/31/17   [provider]  pantoprazole (PROTONIX) 40 MG tablet Take 1 tablet (40 mg total) by mouth daily. Patient not taking: Reported on 01/08/2024 09/17/20   Gilda Crease, MD      Allergies    Penicillins    Review of Systems   Review of Systems  Physical Exam Updated Vital Signs BP 135/83 (BP Location: Left Arm)   Pulse 71   Temp 98.4 F (36.9 C) (Oral)   Resp 18   Ht 6\' 1"  (1.854 m)   Wt 87.1 kg   SpO2 99%   BMI 25.33 kg/m  Physical Exam Vitals and nursing note reviewed.  Constitutional:      General: He is not in acute distress.    Appearance: He is well-developed.  HENT:     Head: Normocephalic and atraumatic.     Right Ear: External ear normal.     Left Ear: External ear normal.     Nose: Nose normal.     Mouth/Throat:     Mouth: Mucous membranes are moist.     Pharynx: Oropharynx is  clear.  Eyes:     Extraocular Movements: Extraocular movements intact.     Conjunctiva/sclera: Conjunctivae normal.  Cardiovascular:     Rate and Rhythm: Normal rate and regular rhythm.     Pulses: Normal pulses.     Heart sounds: Normal heart sounds. No murmur heard. Pulmonary:     Effort: Pulmonary effort is normal. No respiratory distress.     Breath sounds: Normal breath sounds.  Abdominal:     Palpations: Abdomen is soft.     Tenderness: There is no abdominal tenderness.  Musculoskeletal:        General: No swelling.     Cervical back: Neck supple.  Skin:    General: Skin is warm and dry.     Capillary Refill: Capillary refill takes less than 2 seconds.  Neurological:     General: No focal deficit present.     Mental Status: He is alert.     Motor: No weakness.  Psychiatric:        Mood and Affect: Mood normal.     ED Results / Procedures / Treatments   Labs (all labs ordered are listed, but only abnormal results are displayed) Labs Reviewed  COMPREHENSIVE METABOLIC  PANEL - Abnormal; Notable for the following components:      Result Value   Calcium 8.8 (*)    All other components within normal limits  SALICYLATE LEVEL - Abnormal; Notable for the following components:   Salicylate Lvl <7.0 (*)    All other components within normal limits  CBC WITH DIFFERENTIAL/PLATELET  ACETAMINOPHEN LEVEL  RAPID URINE DRUG SCREEN, HOSP PERFORMED    EKG None  Radiology No results found.  Procedures Procedures    Medications Ordered in ED Medications - No data to display  ED Course/ Medical Decision Making/ A&P                                 Medical Decision Making Amount and/or Complexity of Data Reviewed Labs: ordered.  Kathlene November  This patient presents to the ED with chief complaint(s) of Tylenol ingestion with pertinent past medical history of none which further complicates the presenting complaint. The complaint involves an extensive differential diagnosis and  also carries with it a high risk of complications and morbidity.    The differential diagnosis includes Tylenol toxicity, accidental ingestion, SI  Additional history obtained: Records reviewed Care Everywhere/External Records  ED Course and Reassessment:   Independent labs interpretation:  The following labs were independently interpreted:  Pertinent labs include acetaminophen level of 25   Consultation: - Consulted or discussed management/test interpretation w/ external professional: Contacted poison control and spoke to [place] who stated that the patient was safe for discharge at this time after having a Tylenol level of 25 approximately 3 and half hours after ingestion.  Consideration for admission or further workup: Considered for mission further workup however patient's vital signs, physical exam, and labs were reassuring.  Patient discharged after consulting poison control who agreed with disposition.        Final Clinical Impression(s) / ED Diagnoses Final diagnoses:  Accidental ingestion of substance, initial encounter    Rx / DC Orders ED Discharge Orders     None         Dolphus Jenny, PA-C 01/26/24 1400    Rolan Bucco, MD 01/26/24 [phone]

## 2024-01-26 NOTE — Discharge Instructions (Signed)
 Today you are seen for an accidental ingestion of too much Tylenol.  In the future please be careful to look at the pill dosage prior to taking medication.  Thank you for letting us treat you today. After performing a physical exam and reviewing your labs, I feel you are safe to go home. Please follow up with your PCP in the next several days and provide them with your records from this visit. Return to the Emergency Room if pain becomes severe or symptoms worsen.

## 2024-02-04 ENCOUNTER — Other Ambulatory Visit: Payer: Self-pay | Admitting: Physician Assistant

## 2024-02-05 NOTE — Telephone Encounter (Signed)
 Erin Mecum, PA-C is no longer with [facility] reason this has been refused.
